# Patient Record
Sex: Female | Born: 1965 | Race: Black or African American | Hispanic: No | Smoking: Never smoker
Health system: Southern US, Community
[De-identification: ages and names within clinical notes are randomized; demographics above are authoritative.]

## PROBLEM LIST (undated history)

## (undated) DIAGNOSIS — D219 Benign neoplasm of connective and other soft tissue, unspecified: Secondary | ICD-10-CM

## (undated) HISTORY — PX: TUBAL LIGATION: SHX77

## (undated) HISTORY — PX: OTHER SURGICAL HISTORY: SHX169

---

## 2008-10-24 ENCOUNTER — Encounter: Admission: RE | Admit: 2008-10-24 | Discharge: 2008-10-24 | Payer: Self-pay | Admitting: Physician Assistant

## 2011-03-09 ENCOUNTER — Other Ambulatory Visit: Payer: Self-pay | Admitting: Obstetrics and Gynecology

## 2011-03-09 ENCOUNTER — Other Ambulatory Visit (HOSPITAL_COMMUNITY)
Admission: RE | Admit: 2011-03-09 | Discharge: 2011-03-09 | Disposition: A | Discharge status: Home / Self Care | Payer: Medicaid Other | Source: Ambulatory Visit | Attending: Obstetrics and Gynecology | Admitting: Obstetrics and Gynecology

## 2011-03-09 DIAGNOSIS — Z01419 Encounter for gynecological examination (general) (routine) without abnormal findings: Secondary | ICD-10-CM | POA: Insufficient documentation

## 2011-03-09 DIAGNOSIS — Z113 Encounter for screening for infections with a predominantly sexual mode of transmission: Secondary | ICD-10-CM | POA: Insufficient documentation

## 2011-03-10 ENCOUNTER — Other Ambulatory Visit: Payer: Self-pay | Admitting: Obstetrics and Gynecology

## 2013-07-20 ENCOUNTER — Emergency Department (HOSPITAL_COMMUNITY)
Admission: EM | Admit: 2013-07-20 | Discharge: 2013-07-20 | Disposition: A | Discharge status: Home / Self Care | Payer: Managed Care, Other (non HMO) | Attending: Emergency Medicine | Admitting: Emergency Medicine

## 2013-07-20 ENCOUNTER — Encounter (HOSPITAL_COMMUNITY): Payer: Self-pay | Admitting: Emergency Medicine

## 2013-07-20 ENCOUNTER — Emergency Department (HOSPITAL_COMMUNITY): Payer: Managed Care, Other (non HMO)

## 2013-07-20 DIAGNOSIS — R071 Chest pain on breathing: Secondary | ICD-10-CM | POA: Insufficient documentation

## 2013-07-20 DIAGNOSIS — Z79899 Other long term (current) drug therapy: Secondary | ICD-10-CM | POA: Insufficient documentation

## 2013-07-20 DIAGNOSIS — R0789 Other chest pain: Secondary | ICD-10-CM

## 2013-07-20 DIAGNOSIS — Z880 Allergy status to penicillin: Secondary | ICD-10-CM | POA: Insufficient documentation

## 2013-07-20 LAB — BASIC METABOLIC PANEL
CO2: 25 mEq/L (ref 19–32)
Calcium: 8.5 mg/dL (ref 8.4–10.5)
Chloride: 104 mEq/L (ref 96–112)
Glucose, Bld: 100 mg/dL — ABNORMAL HIGH (ref 70–99)
Sodium: 140 mEq/L (ref 135–145)

## 2013-07-20 LAB — POCT I-STAT TROPONIN I: Troponin i, poc: 0 ng/mL (ref 0.00–0.08)

## 2013-07-20 LAB — CBC
Hemoglobin: 10.1 g/dL — ABNORMAL LOW (ref 12.0–15.0)
MCH: 26 pg (ref 26.0–34.0)
MCV: 80.7 fL (ref 78.0–100.0)
RBC: 3.89 MIL/uL (ref 3.87–5.11)

## 2013-07-20 MED ORDER — IBUPROFEN 800 MG PO TABS: 800.0000 mg | ORAL_TABLET | Freq: Three times a day (TID) | ORAL | Status: DC | PRN

## 2013-07-20 MED ORDER — HYDROCODONE-ACETAMINOPHEN 5-325 MG PO TABS: 1.0000 | ORAL_TABLET | Freq: Four times a day (QID) | ORAL | Status: DC | PRN

## 2013-07-20 NOTE — ED Provider Notes (Signed)
CSN: [id number]     Arrival date & time 07/20/13  1508 History   First MD Initiated Contact with Patient 07/20/13 1526     Chief Complaint  Patient presents with  . Chest Pain   (Consider location/radiation/quality/duration/timing/severity/associated sxs/prior Treatment) HPI Patient is a 47 year old female with no PMH who presents to the Emergency Department complaining of 4-5 episodes of chest pain that started at 9 am today. Patient reports that she had 4-5 episodes of squeezing substernal chest pain today that lasted for 2-3 seconds with 1-2 hours between each episode. She reports that one of the episodes caused radiation of pain to her back. No radiation to her arms or jaw. She denies anything that triggered the pain, stating some of the episodes occurred while she was at rest and some occurred when she was walking. She denies SOB but reports someone told her she sounded SOB when she had the pain.  She reports that she felt nauseous this morning which resolved with eating and a non-productive cough that started this morning. She denies hemoptysis, fever, chills, diaphoresis, abdominal pain, constipation, diarrhea, urinary symptoms, lightheadedness, syncope, lower extremity swelling, and estrogen use. She took a 325 ASA prior to arrival. Patient denies tobacco use and reports occassional alcohol use. Patient currently is not having any chest pain.     History reviewed. No pertinent past medical history. No past surgical history on file. No family history on file. History  Substance Use Topics  . Smoking status: Not on file  . Smokeless tobacco: Not on file  . Alcohol Use: Not on file   OB History   Grav Para Term Preterm Abortions TAB SAB Ect Mult Living                 Review of Systems All other systems negative except as documented in the HPI. All pertinent positives and negatives as reviewed in the HPI.   Allergies  Penicillins  Home Medications   Current Outpatient Rx  Name   Route  Sig  Dispense  Refill  . Multiple Vitamins-Minerals (MULTIVITAMIN WITH MINERALS) tablet   Oral   Take 1 tablet by mouth daily.         . Nutritional Supp - Diet Aids (ULTRA SLIM QUICK PO)   Oral   Take 2 tablets by mouth daily.          BP 153/89  Pulse 104  Temp(Src) 98.1 F (36.7 C) (Oral)  Resp 20  Wt 175 lb (79.379 kg)  SpO2 100% Physical Exam  Nursing note and vitals reviewed. Constitutional: She is oriented to person, place, and time. She appears well-developed and well-nourished. No distress.  HENT:  Head: Normocephalic and atraumatic.  Eyes: Pupils are equal, round, and reactive to light.  Neck: Normal range of motion. Neck supple.  Cardiovascular: Normal rate, regular rhythm, normal heart sounds and intact distal pulses.  Exam reveals no gallop and no friction rub.   No murmur heard. Pulmonary/Chest: Effort normal and breath sounds normal. No respiratory distress. She has no wheezes. She has no rales.  Abdominal: Soft. Bowel sounds are normal. She exhibits no distension. There is no tenderness. There is no rebound and no guarding.  Musculoskeletal: Normal range of motion. She exhibits no edema and no tenderness.  Neurological: She is alert and oriented to person, place, and time.  Skin: Skin is warm and dry. She is not diaphoretic.  Psychiatric: She has a normal mood and affect. Her behavior is normal.  ED Course  Procedures (including critical care time) Labs Review Labs Reviewed  CBC - Abnormal; Notable for the following:    Hemoglobin 10.1 (*)    HCT 31.4 (*)    RDW 16.4 (*)    All other components within normal limits  BASIC METABOLIC PANEL  POCT I-STAT TROPONIN I    Patient has atypical symptoms for cardiac chest pain, as the pain, only last 2-3 seconds at a time.  Patient has been coughing.  Patient is PERC negative.  Patient is advised to return here as needed.  [facility]   Carlyle Dolly, PA-C 07/22/13 [phone]

## 2013-07-20 NOTE — ED Notes (Signed)
Pt c/o central cp that radiates to her back that started this morning. Pt denies any pain at this time. Pt described pain as squeezing.

## 2013-07-20 NOTE — ED Notes (Signed)
Discharge and follow up instructions reviewed. Pt verbalized understanding.  

## 2013-07-20 NOTE — ED Notes (Signed)
Pt reports she is having the squeezing feeling in her chest again. Pt states it lasted for about 2 seconds and then went away. Pt states it feels like a "spasm". Pain did not radiate. Denies any pain at this time.

## 2013-07-20 NOTE — ED Notes (Addendum)
Pt in c/o intermittent chest pain since this morning, states the pain comes on suddenly and lasts for a few seconds and is a squeezing pain, states a few times the pain has radiated into her back, denies pain at this time, states the episodes have occurred around 4-5 times today. C/o mild nausea at times, denies other symptoms. Pt took 325mg  ASA prior to arrival.

## 2013-07-22 NOTE — ED Provider Notes (Signed)
Medical screening examination/treatment/procedure(s) were performed by non-physician practitioner and as supervising physician I was immediately available for consultation/collaboration.  Flint Melter, MD 07/22/13 [phone]

## 2014-11-29 ENCOUNTER — Ambulatory Visit (INDEPENDENT_AMBULATORY_CARE_PROVIDER_SITE_OTHER): Payer: Self-pay | Admitting: Physician Assistant

## 2014-11-29 VITALS — BP 136/92 | HR 107 | Temp 97.9°F | Resp 18 | Ht 61.5 in | Wt 180.4 lb

## 2014-11-29 DIAGNOSIS — L299 Pruritus, unspecified: Secondary | ICD-10-CM

## 2014-11-29 DIAGNOSIS — L259 Unspecified contact dermatitis, unspecified cause: Secondary | ICD-10-CM

## 2014-11-29 MED ORDER — HYDROXYZINE HCL 25 MG PO TABS: 25.0000 mg | ORAL_TABLET | Freq: Three times a day (TID) | ORAL | Status: DC | PRN

## 2014-11-29 MED ORDER — METHYLPREDNISOLONE ACETATE 80 MG/ML IJ SUSP
80.0000 mg | Freq: Once | INTRAMUSCULAR | Status: AC
  Administered 2014-11-29: 80 mg via INTRAMUSCULAR

## 2014-11-29 MED ORDER — PREDNISONE 10 MG PO TABS: ORAL_TABLET | ORAL | Status: DC

## 2014-11-29 NOTE — Progress Notes (Signed)
   Subjective:    Patient ID: Samantha Fernandez, female    DOB: 23-Jan-1966, 49 y.o.   MRN: [id number]  HPI Pt presents to clinic with itching and burning and rash on her left arm that she has had about 9 days.  It started as a small red area on the flexor surface of her right elbow and then it got worse.  She was moving dusty boxes the day before she developed the rash.  She was working in the yard but she thinks that it was after the rash develop.  She does her ar with her arm on the open windowsill and thought maybe she for a sunburn.  The rash has continued to get worse and spread.  When she was moving and working in the yard she had on a tanktop and gloves.  Review of Systems  Constitutional: Negative for fever and chills.  Skin: Positive for rash.   She is otherwise healthy and only takes a MVI daily.     Objective:   Physical Exam  Constitutional: She is oriented to person, place, and time. She appears well-developed and well-nourished.  BP 136/92 mmHg  Pulse 107  Temp(Src) 97.9 F (36.6 C) (Oral)  Resp 18  Ht 5' 1.5" (1.562 m)  Wt 180 lb 6.4 oz (81.829 kg)  BMI 33.54 kg/m2  SpO2 98%   HENT:  Head: Normocephalic and atraumatic.  Right Ear: External ear normal.  Left Ear: External ear normal.  Cardiovascular:  No murmur heard. Pulmonary/Chest: Effort normal.  Neurological: She is alert and oriented to person, place, and time.  Skin: Skin is warm and dry.     Confluent erythematous papulovesicular rash mainly on her left arm, some of the area is oozing serous fluid - there are some scattered confluent areas on her neck, cleavage area and axillary edges  Psychiatric: She has a normal mood and affect. Her behavior is normal. Judgment and thought content normal.      Assessment & Plan:  Contact dermatitis - Plan: methylPREDNISolone acetate (DEPO-MEDROL) injection 80 mg, predniSONE (DELTASONE) 10 MG tablet  Itching - Plan: hydrOXYzine (ATARAX/VISTARIL) 25 MG tablet  This  is most consistent with poison ivy though patient is not aware of exposure.  It is definitely a contact dermatitis that is not currently infection.  We will treat with steroids.  A drsg was placed over the wound - nonstick and stockingnet as a sleeve. She will f/u if the rash worsens and definitley if there are signs on infection.  Windell Hummingbird PA-C  [facility] 11/29/2014 10:34 AM

## 2014-11-29 NOTE — Patient Instructions (Addendum)
You have a contact dermatitis but we are not sure what you are having a reaction to.  We are going to treat the inflammation with steroids.  Getting hot will make the itching worse.  Try to keep the area that is oozing covered to prevent infection.  If there are any signs of infection (fever, pus drainage or pain development) please return to clinic.

## 2017-01-27 ENCOUNTER — Ambulatory Visit: Payer: Self-pay | Admitting: Emergency Medicine

## 2017-01-28 ENCOUNTER — Ambulatory Visit (INDEPENDENT_AMBULATORY_CARE_PROVIDER_SITE_OTHER): Payer: Self-pay | Admitting: Emergency Medicine

## 2017-01-28 ENCOUNTER — Encounter: Payer: Self-pay | Admitting: Emergency Medicine

## 2017-01-28 VITALS — BP 150/88 | HR 92 | Temp 97.7°F | Resp 16 | Ht 62.0 in | Wt 182.8 lb

## 2017-01-28 DIAGNOSIS — R0789 Other chest pain: Secondary | ICD-10-CM

## 2017-01-28 DIAGNOSIS — T304 Corrosion of unspecified body region, unspecified degree: Secondary | ICD-10-CM

## 2017-01-28 DIAGNOSIS — T300 Burn of unspecified body region, unspecified degree: Secondary | ICD-10-CM

## 2017-01-28 MED ORDER — SILVER SULFADIAZINE 1 % EX CREA: 1.0000 "application " | TOPICAL_CREAM | Freq: Every day | CUTANEOUS | 0 refills | Status: DC

## 2017-01-28 MED ORDER — DICLOFENAC SODIUM 75 MG PO TBEC: 75.0000 mg | DELAYED_RELEASE_TABLET | Freq: Two times a day (BID) | ORAL | 1 refills | Status: AC

## 2017-01-28 NOTE — Patient Instructions (Addendum)
IF you received an x-ray today, you will receive an invoice from [facility]. Please contact [facility] at [phone] with questions or concerns regarding your invoice.   IF you received labwork today, you will receive an invoice from [facility]. Please contact [facility] at [phone] with questions or concerns regarding your invoice.   Our billing staff will not be able to assist you with questions regarding bills from these companies.  You will be contacted with the lab results as soon as they are available. The fastest way to get your results is to activate your My Chart account. Instructions are located on the last page of this paperwork. If you have not heard from [place] regarding the results in 2 weeks, please contact this office.    We recommend that you schedule a mammogram for breast cancer screening. Typically, you do not need a referral to do this. Please contact a local imaging center to schedule your mammogram.  [facility] - [phone]  *ask for the Radiology Department [facility] ([place]) - [phone] or [phone]  [facility] - [phone] [place] [phone] [facility] - [phone]  *ask for the [facility] - [phone]  *ask for the Radiology Department [facility] - [phone]  *ask for the [facility] - [phone] Burn Care, Adult A burn is an injury to the skin or the tissues under the skin. There are three types of burns:  First degree. These burns may cause the skin to be red and a bit swollen.  Second degree. These burns are very painful and cause the skin to be very red. The skin may also leak fluid, look shiny, and start to have blisters.  Third degree. These burns cause permanent damage. They turn the skin white or black and make it look charred, dry, and  leathery.  Taking care of your burn properly can help to prevent pain and infection. It can also help the burn to heal more quickly. How is this treated? Right after a burn:  Lightly cover the burn with gauze.  Get medical help right away. Burn care  Raise (elevate) the injured area above the level of your heart while sitting or lying down.  Drink enough fluid to keep your pee (urine) clear or pale yellow.  Rest as told by your doctor. Do not start playing sports or doing other physical activities until your doctor says it is okay.  Follow instructions from your doctor about: ? How to clean and take care of the burn. ? When to change and remove the bandage (dressing).  Check your burn every day for signs of infection. Check for: ? More redness, swelling, or pain. ? Warmth. ? Pus or a bad smell. Medicine  Take over-the-counter and prescription medicines only as told by your doctor.  If you were prescribed antibiotic medicine, take or apply it as told by your doctor. Do not stop using the antibiotic even if your condition improves. General instructions  To prevent infection: ? Do not put butter, oil, or other home treatments on the burn. ? Do not scratch or pick at the burn. ? Do not break any blisters. ? Do not peel skin.  Do not rub your burn, even when you are cleaning it.  Protect your burn from the sun.  Keep all follow-up visits as told by your doctor. This is important. Contact a  doctor if:  Your condition does not get better.  Your condition gets worse.  You have a fever.  Your burn looks different or starts to have black or red spots on it.  Your burn feels warm to the touch.  Your pain is not controlled with medicine. Get help right away if:  You have redness, swelling, or pain at the site of the burn.  You have fluid, blood, or pus coming from your burn.  You have red streaks near the burn.  You have very bad pain. How is this treated? Right  after a burn:  Rinse or soak the burn under cool water until the pain stops. Do not put ice on your burn. That can cause more damage.  Lightly cover the burn with a clean (sterile) cloth (dressing). Burn care  Follow instructions from your doctor about: ? How to clean and take care of the burn. ? When to change and remove the cloth.  Check your burn every day for signs of infection. Check for: ? More redness, swelling, or pain. ? Warmth. ? Pus or a bad smell. Medicine  Take over-the-counter and prescription medicines only as told by your doctor.  If you were prescribed antibiotic medicine, take or apply it as told by your doctor. Do not stop using the antibiotic even if your condition improves. General instructions  To prevent infection, do not put butter, oil, or other home treatments on your burn.  Do not rub your burn, even when you are cleaning it.  Protect your burn from the sun. How is this treated? Right after a burn:  Rinse or soak the burn under cool water. Do this for several minutes. Do not put ice on your burn. That can cause more damage.  Lightly cover the burn with a clean (sterile) cloth (dressing). Burn care  Raise (elevate) the injured area above the level of your heart while sitting or lying down.  Follow instructions from your doctor about: ? How to clean and take care of the burn. ? When to change and remove the cloth.  Check your burn every day for signs of infection. Check for: ? More redness, swelling, or pain. ? Warmth. ? Pus or a bad smell. Medicine   Take over-the-counter and prescription medicines only as told by your doctor.  If you were prescribed antibiotic medicine, take or apply it as told by your doctor. Do not stop using the antibiotic even if your condition improves. General instructions  To prevent infection: ? Do not put butter, oil, or other home treatments on the burn. ? Do not scratch or pick at the burn. ? Do not break  any blisters. ? Do not peel skin.  Do not rub your burn, even when you are cleaning it.  Protect your burn from the sun. This information is not intended to replace advice given to you by your health care provider. Make sure you discuss any questions you have with your health care provider. Document Released: 05/04/2008 Document Revised: 02/15/2016 Document Reviewed: 01/13/2016 Elsevier Interactive Patient Education  2017 [employer].
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
## 2017-02-10 ENCOUNTER — Encounter: Payer: Self-pay | Admitting: Emergency Medicine

## 2017-02-10 ENCOUNTER — Ambulatory Visit (INDEPENDENT_AMBULATORY_CARE_PROVIDER_SITE_OTHER): Payer: Self-pay | Admitting: Emergency Medicine

## 2017-02-10 VITALS — BP 154/98 | HR 98 | Temp 98.1°F | Resp 16 | Ht 62.0 in | Wt 183.0 lb

## 2017-02-10 DIAGNOSIS — L299 Pruritus, unspecified: Secondary | ICD-10-CM

## 2017-02-10 DIAGNOSIS — R21 Rash and other nonspecific skin eruption: Secondary | ICD-10-CM | POA: Insufficient documentation

## 2017-02-10 DIAGNOSIS — L309 Dermatitis, unspecified: Secondary | ICD-10-CM | POA: Insufficient documentation

## 2017-02-10 MED ORDER — METHYLPREDNISOLONE ACETATE 80 MG/ML IJ SUSP
80.0000 mg | Freq: Once | INTRAMUSCULAR | Status: AC
  Administered 2017-02-10: 80 mg via INTRAMUSCULAR

## 2017-02-10 MED ORDER — PREDNISONE 20 MG PO TABS: 40.0000 mg | ORAL_TABLET | Freq: Every day | ORAL | 0 refills | Status: AC

## 2017-02-10 NOTE — Patient Instructions (Signed)
Contact Dermatitis Dermatitis is redness, soreness, and swelling (inflammation) of the skin. Contact dermatitis is a reaction to certain substances that touch the skin. You either touched something that irritated your skin, or you have allergies to something you touched. Follow these instructions at home: Skin Care  Moisturize your skin as needed.  Apply cool compresses to the affected areas.  Try taking a bath with: ? Epsom salts. Follow the instructions on the package. You can get these at a pharmacy or grocery store. ? Baking soda. Pour a small amount into the bath as told by your doctor. ? Colloidal oatmeal. Follow the instructions on the package. You can get this at a pharmacy or grocery store.  Try applying baking soda paste to your skin. Stir water into baking soda until it looks like paste.  Do not scratch your skin.  Bathe less often.  Bathe in lukewarm water. Avoid using hot water. Medicines  Take or apply over-the-counter and prescription medicines only as told by your doctor.  If you were prescribed an antibiotic medicine, take or apply your antibiotic as told by your doctor. Do not stop taking the antibiotic even if your condition starts to get better. General instructions  Keep all follow-up visits as told by your doctor. This is important.  Avoid the substance that caused your reaction. If you do not know what caused it, keep a journal to try to track what caused it. Write down: ? What you eat. ? What cosmetic products you use. ? What you drink. ? What you wear in the affected area. This includes jewelry.  If you were given a bandage (dressing), take care of it as told by your doctor. This includes when to change and remove it. Contact a doctor if:  You do not get better with treatment.  Your condition gets worse.  You have signs of infection such as: ? Swelling. ? Tenderness. ? Redness. ? Soreness. ? Warmth.  You have a fever.  You have new  symptoms. Get help right away if:  You have a very bad headache.  You have neck pain.  Your neck is stiff.  You throw up (vomit).  You feel very sleepy.  You see red streaks coming from the affected area.  Your bone or joint underneath the affected area becomes painful after the skin has healed.  The affected area turns darker.  You have trouble breathing. This information is not intended to replace advice given to you by your health care provider. Make sure you discuss any questions you have with your health care provider. Document Released: 05/23/2009 Document Revised: 01/01/2016 Document Reviewed: 12/11/2014 Elsevier Interactive Patient Education  2018 Elsevier Inc.  
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
## 2018-01-04 ENCOUNTER — Other Ambulatory Visit: Payer: Self-pay | Admitting: Obstetrics and Gynecology

## 2018-01-04 DIAGNOSIS — D250 Submucous leiomyoma of uterus: Secondary | ICD-10-CM

## 2018-01-18 ENCOUNTER — Ambulatory Visit
Admission: RE | Admit: 2018-01-18 | Discharge: 2018-01-18 | Disposition: A | Discharge status: Home / Self Care | Payer: Managed Care, Other (non HMO) | Source: Ambulatory Visit | Attending: Obstetrics and Gynecology | Admitting: Obstetrics and Gynecology

## 2018-01-18 ENCOUNTER — Other Ambulatory Visit: Payer: Self-pay | Admitting: Obstetrics and Gynecology

## 2018-01-18 DIAGNOSIS — D250 Submucous leiomyoma of uterus: Secondary | ICD-10-CM

## 2018-01-18 HISTORY — DX: Benign neoplasm of connective and other soft tissue, unspecified: D21.9

## 2018-01-18 HISTORY — PX: IR RADIOLOGIST EVAL & MGMT: IMG5224

## 2018-01-18 NOTE — Consult Note (Signed)
Chief Complaint: Symptomatic uterine fibroids.  History of Present Illness: Samantha Fernandez is a 52 y.o. G43P1 African-American female with a long-standing history of uterine fibroids.  She has had a history of menorrhagia which recently has been worsening.  Menstrual cycles were quite regular until last October then became irregular.  Once they resumed this spring, she has had much heavier periods lasting approximately 7 days with 4 days of clots.  However, her last cycle in May lasted for 3 weeks.  She just began another cycle on 01/14/2018.  She was previously documented to be anemic in April with a hemoglobin of 10.8 and was started on iron replacement.  She has additional bulk symptoms of very frequent urination, nocturia and low back pain.  She has not had prior fibroid surgery.  Office pelvic ultrasound on 12/13/2017 demonstrated more than 6 fibroids with the largest 3 representing a posterior subserosal fibroid measuring 4.6 cm, a posterior pedunculated fibroid measuring 3.7 cm and a left-sided intramural fibroid measuring 3.3 cm.  Several other submucosal fibroids were visualized.  Pap smear on 12/13/2017 was negative.  A benign endocervical polyp was removed on 11/23/2017.  She denies any history of gynecologic infections.  She has 59 living 50 year old fraternal twins delivered by cesarean section.  She has no future pregnancy plans.  She is married and works full-time as a IT sales professional for [employer] and [employer]  Past Medical History:  Diagnosis Date  . Fibroids     No past surgical history on file.  Allergies: Penicillins  Medications: Prior to Admission medications   Medication Sig Start Date End Date Taking? Authorizing Provider  hydrOXYzine (ATARAX/VISTARIL) 25 MG tablet Take 1 tablet (25 mg total) by mouth 3 (three) times daily as needed for itching. 11/29/14  Yes Weber, Damaris Hippo, PA-C  Multiple Vitamins-Minerals (MULTIVITAMIN WITH MINERALS) tablet Take 1 tablet  by mouth daily.   Yes [provider]  silver sulfADIAZINE (SILVADENE) 1 % cream Apply 1 application topically daily. Patient not taking: Reported on 02/10/2017 01/28/17   Horald Pollen, MD     Family History  Problem Relation Age of Onset  . Diabetes Mother   . Diabetes Father   . Cancer Father   . Hypertension Father     Social History   Socioeconomic History  . Marital status: Widowed    Spouse name: Not on file  . Number of children: Not on file  . Years of education: Not on file  . Highest education level: Not on file  Occupational History  . Not on file  Social Needs  . Financial resource strain: Not on file  . Food insecurity:    Worry: Not on file    Inability: Not on file  . Transportation needs:    Medical: Not on file    Non-medical: Not on file  Tobacco Use  . Smoking status: Never Smoker  . Smokeless tobacco: Never Used  Substance and Sexual Activity  . Alcohol use: No    Alcohol/week: 0.0 oz  . Drug use: No  . Sexual activity: Yes  Lifestyle  . Physical activity:    Days per week: Not on file    Minutes per session: Not on file  . Stress: Not on file  Relationships  . Social connections:    Talks on phone: Not on file    Gets together: Not on file    Attends religious service: Not on file    Active member of club or  organization: Not on file    Attends meetings of clubs or organizations: Not on file    Relationship status: Not on file  Other Topics Concern  . Not on file  Social History Narrative  . Not on file    Review of Systems: A 12 point ROS discussed and pertinent positives are indicated in the HPI above.  All other systems are negative.  Review of Systems  Constitutional: Negative.   HENT: Negative.   Respiratory: Negative.   Cardiovascular: Negative.   Gastrointestinal: Negative.   Endocrine: Negative.   Genitourinary: Positive for frequency, menstrual problem, pelvic pain and urgency. Negative for dysuria, flank  pain, hematuria and vaginal discharge.  Musculoskeletal: Positive for back pain. Negative for arthralgias, gait problem and joint swelling.  Skin: Negative.   Neurological: Negative.   Hematological: Negative.     Vital Signs: BP 136/67   Pulse (!) 104   Temp 98 F (36.7 C) (Oral)   Resp 15   Ht _0  (1.575 m)   Wt 185 lb (83.9 kg)   LMP 01/14/2018   SpO2 98%   BMI 33.84 kg/m   Physical Exam  Constitutional: She is oriented to person, place, and time. She appears well-developed and well-nourished. No distress.  HENT:  Head: Normocephalic and atraumatic.  Neck: Neck supple. No JVD present. No tracheal deviation present. No thyromegaly present.  Cardiovascular: Normal rate, regular rhythm, normal heart sounds and intact distal pulses. Exam reveals no gallop and no friction rub.  No murmur heard. Normal bilateral femoral pulses.  Pulmonary/Chest: Effort normal and breath sounds normal. No stridor. No respiratory distress. She has no wheezes. She has no rales.  Abdominal: Soft. Bowel sounds are normal. She exhibits no distension. There is no tenderness. There is no rebound and no guarding.  Palpable anterior uterine fibroid in suprapubic region.  Musculoskeletal: She exhibits no edema.  Lymphadenopathy:    She has no cervical adenopathy.  Neurological: She is alert and oriented to person, place, and time.  Skin: Skin is warm and dry. She is not diaphoretic.  Vitals reviewed.   Imaging: No results found.  Labs:  CBC: WBC 8.4, hemoglobin 10.8, hematocrit 31.9 and platelet count 305 on 11/28/2017   Assessment and Plan:  I met with Mrs. Marijean Bravo.  We discussed treatment options for symptomatic uterine leiomyomata including hysterectomy and uterine fibroid embolization.  Her symptoms are increasing and she is interested in pursuing some type of treatment due to protracted menorrhagia.  She is particularly interested in fibroid embolization as a less invasive alternative with less  recovery time.  I discussed details of embolization with her including technical details, risks and expected outcomes.  I described the need for preprocedural MRI of the pelvis with and without gadolinium in order to fully define fibroid morphology and enhancement pattern in determining whether she is an appropriate candidate for embolization.  We will schedule an outpatient MRI of the pelvis.  After reviewing the MRI, I will contact Mrs. Ford and if she is a candidate and if she would like to proceed with embolization, we will begin the authorization and scheduling process.  The procedure would involve overnight observation at [facility] afterwards.  Thank you for this interesting consult.  I greatly enjoyed meeting IZZABELLA BESSE and look forward to participating in their care.  A copy of this report was sent to the requesting provider on this date.  Electronically SignedAletta Edouard T 01/18/2018, 10:05 AM     I spent a  total of 40 Minutes in face to face in clinical consultation, greater than 50% of which was counseling/coordinating care for symptomatic uterine fibroid disease.

## 2018-01-28 ENCOUNTER — Ambulatory Visit
Admission: RE | Admit: 2018-01-28 | Discharge: 2018-01-28 | Disposition: A | Discharge status: Home / Self Care | Payer: Managed Care, Other (non HMO) | Source: Ambulatory Visit | Attending: Obstetrics and Gynecology | Admitting: Obstetrics and Gynecology

## 2018-01-28 DIAGNOSIS — D250 Submucous leiomyoma of uterus: Secondary | ICD-10-CM

## 2018-01-28 MED ORDER — GADOBENATE DIMEGLUMINE 529 MG/ML IV SOLN
17.0000 mL | Freq: Once | INTRAVENOUS | Status: AC | PRN
  Administered 2018-01-28: 17 mL via INTRAVENOUS

## 2018-02-14 ENCOUNTER — Other Ambulatory Visit (HOSPITAL_COMMUNITY): Payer: Self-pay | Admitting: Interventional Radiology

## 2018-02-14 DIAGNOSIS — D259 Leiomyoma of uterus, unspecified: Secondary | ICD-10-CM

## 2018-05-04 ENCOUNTER — Other Ambulatory Visit: Payer: Self-pay | Admitting: Student

## 2018-05-05 ENCOUNTER — Other Ambulatory Visit: Payer: Self-pay

## 2018-05-05 ENCOUNTER — Observation Stay (HOSPITAL_COMMUNITY)
Admission: RE | Admit: 2018-05-05 | Discharge: 2018-05-06 | Disposition: A | Discharge status: Home / Self Care | Payer: 59 | Source: Ambulatory Visit | Attending: Interventional Radiology | Admitting: Interventional Radiology

## 2018-05-05 ENCOUNTER — Ambulatory Visit (HOSPITAL_COMMUNITY)
Admission: RE | Admit: 2018-05-05 | Discharge: 2018-05-05 | Disposition: A | Discharge status: Home / Self Care | Payer: 59 | Source: Ambulatory Visit | Attending: Interventional Radiology | Admitting: Interventional Radiology

## 2018-05-05 ENCOUNTER — Encounter (HOSPITAL_COMMUNITY): Payer: Self-pay

## 2018-05-05 DIAGNOSIS — N920 Excessive and frequent menstruation with regular cycle: Secondary | ICD-10-CM | POA: Diagnosis not present

## 2018-05-05 DIAGNOSIS — D259 Leiomyoma of uterus, unspecified: Principal | ICD-10-CM | POA: Insufficient documentation

## 2018-05-05 DIAGNOSIS — Z880 Allergy status to penicillin: Secondary | ICD-10-CM | POA: Insufficient documentation

## 2018-05-05 DIAGNOSIS — Z9889 Other specified postprocedural states: Secondary | ICD-10-CM | POA: Insufficient documentation

## 2018-05-05 DIAGNOSIS — Z9851 Tubal ligation status: Secondary | ICD-10-CM | POA: Insufficient documentation

## 2018-05-05 HISTORY — PX: IR EMBO TUMOR ORGAN ISCHEMIA INFARCT INC GUIDE ROADMAPPING: IMG5449

## 2018-05-05 HISTORY — PX: IR ANGIOGRAM PELVIS SELECTIVE OR SUPRASELECTIVE: IMG661

## 2018-05-05 HISTORY — PX: IR US GUIDE VASC ACCESS RIGHT: IMG2390

## 2018-05-05 HISTORY — PX: IR ANGIOGRAM SELECTIVE EACH ADDITIONAL VESSEL: IMG667

## 2018-05-05 LAB — CBC WITH DIFFERENTIAL/PLATELET
BASOS ABS: 0 10*3/uL (ref 0.0–0.1)
BASOS PCT: 1 %
EOS PCT: 2 %
Eosinophils Absolute: 0.1 10*3/uL (ref 0.0–0.7)
HEMATOCRIT: 38.1 % (ref 36.0–46.0)
Hemoglobin: 12.5 g/dL (ref 12.0–15.0)
Lymphocytes Relative: 40 %
Lymphs Abs: 2.2 10*3/uL (ref 0.7–4.0)
MCH: 27.6 pg (ref 26.0–34.0)
MCHC: 32.8 g/dL (ref 30.0–36.0)
MCV: 84.1 fL (ref 78.0–100.0)
MONO ABS: 0.4 10*3/uL (ref 0.1–1.0)
Monocytes Relative: 7 %
NEUTROS ABS: 2.8 10*3/uL (ref 1.7–7.7)
Neutrophils Relative %: 50 %
PLATELETS: 255 10*3/uL (ref 150–400)
RBC: 4.53 MIL/uL (ref 3.87–5.11)
RDW: 19.3 % — AB (ref 11.5–15.5)
WBC: 5.6 10*3/uL (ref 4.0–10.5)

## 2018-05-05 LAB — BASIC METABOLIC PANEL
ANION GAP: 11 (ref 5–15)
BUN: 10 mg/dL (ref 6–20)
CALCIUM: 9 mg/dL (ref 8.9–10.3)
CO2: 23 mmol/L (ref 22–32)
Chloride: 107 mmol/L (ref 98–111)
Creatinine, Ser: 0.88 mg/dL (ref 0.44–1.00)
GFR calc Af Amer: >60 mL/min (ref >60)
GLUCOSE: 118 mg/dL — AB (ref 70–99)
Potassium: 3.8 mmol/L (ref 3.5–5.1)
SODIUM: 141 mmol/L (ref 135–145)

## 2018-05-05 LAB — PROTIME-INR
INR: 0.92
PROTHROMBIN TIME: 12.3 s (ref 11.4–15.2)

## 2018-05-05 LAB — HCG, SERUM, QUALITATIVE: Preg, Serum: NEGATIVE

## 2018-05-05 MED ORDER — MIDAZOLAM HCL 2 MG/2ML IJ SOLN
INTRAMUSCULAR | Status: AC
  Filled 2018-05-05: qty 6

## 2018-05-05 MED ORDER — VANCOMYCIN HCL IN DEXTROSE 1-5 GM/200ML-% IV SOLN
INTRAVENOUS | Status: AC
  Administered 2018-05-05: 1000 mg via INTRAVENOUS
  Filled 2018-05-05: qty 200

## 2018-05-05 MED ORDER — SODIUM CHLORIDE 0.9% FLUSH
3.0000 mL | Freq: Two times a day (BID) | INTRAVENOUS | Status: DC
  Administered 2018-05-06: 3 mL via INTRAVENOUS

## 2018-05-05 MED ORDER — DEXAMETHASONE SODIUM PHOSPHATE 10 MG/ML IJ SOLN
INTRAMUSCULAR | Status: AC
  Administered 2018-05-05: 8 mg via INTRAVENOUS
  Filled 2018-05-05: qty 1

## 2018-05-05 MED ORDER — NALOXONE HCL 0.4 MG/ML IJ SOLN: 0.4000 mg | INTRAMUSCULAR | Status: DC | PRN

## 2018-05-05 MED ORDER — ONDANSETRON HCL 4 MG/2ML IJ SOLN
4.0000 mg | Freq: Four times a day (QID) | INTRAMUSCULAR | Status: DC | PRN
  Administered 2018-05-06 (×2): 4 mg via INTRAVENOUS
  Filled 2018-05-05 (×2): qty 2

## 2018-05-05 MED ORDER — VANCOMYCIN HCL IN DEXTROSE 1-5 GM/200ML-% IV SOLN
1000.0000 mg | Freq: Once | INTRAVENOUS | Status: AC
  Administered 2018-05-05: 1000 mg via INTRAVENOUS

## 2018-05-05 MED ORDER — PROMETHAZINE HCL 25 MG RE SUPP: 25.0000 mg | Freq: Three times a day (TID) | RECTAL | Status: DC | PRN

## 2018-05-05 MED ORDER — DOCUSATE SODIUM 100 MG PO CAPS
100.0000 mg | ORAL_CAPSULE | Freq: Two times a day (BID) | ORAL | Status: DC
  Administered 2018-05-05 – 2018-05-06 (×2): 100 mg via ORAL
  Filled 2018-05-05 (×2): qty 1

## 2018-05-05 MED ORDER — DEXAMETHASONE 4 MG PO TABS
8.0000 mg | ORAL_TABLET | Freq: Once | ORAL | Status: AC
  Administered 2018-05-06: 8 mg via ORAL
  Filled 2018-05-05: qty 2

## 2018-05-05 MED ORDER — NALOXONE HCL 0.4 MG/ML IJ SOLN
INTRAMUSCULAR | Status: AC
  Filled 2018-05-05: qty 1

## 2018-05-05 MED ORDER — SODIUM CHLORIDE 0.9% FLUSH: 3.0000 mL | INTRAVENOUS | Status: DC | PRN

## 2018-05-05 MED ORDER — FENTANYL CITRATE (PF) 100 MCG/2ML IJ SOLN
INTRAMUSCULAR | Status: AC | PRN
  Administered 2018-05-05 (×4): 25 ug via INTRAVENOUS
  Administered 2018-05-05: 50 ug via INTRAVENOUS
  Administered 2018-05-05 (×2): 25 ug via INTRAVENOUS
  Administered 2018-05-05: 50 ug via INTRAVENOUS

## 2018-05-05 MED ORDER — ONDANSETRON HCL 4 MG/2ML IJ SOLN
4.0000 mg | Freq: Once | INTRAMUSCULAR | Status: AC
  Administered 2018-05-05: 4 mg via INTRAVENOUS

## 2018-05-05 MED ORDER — MIDAZOLAM HCL 2 MG/2ML IJ SOLN
INTRAMUSCULAR | Status: AC | PRN
  Administered 2018-05-05 (×5): 0.5 mg via INTRAVENOUS
  Administered 2018-05-05: 1 mg via INTRAVENOUS
  Administered 2018-05-05: 0.5 mg via INTRAVENOUS

## 2018-05-05 MED ORDER — FLUMAZENIL 0.5 MG/5ML IV SOLN
INTRAVENOUS | Status: AC
  Filled 2018-05-05: qty 5

## 2018-05-05 MED ORDER — DIPHENHYDRAMINE HCL 50 MG/ML IJ SOLN: 12.5000 mg | Freq: Four times a day (QID) | INTRAMUSCULAR | Status: DC | PRN

## 2018-05-05 MED ORDER — PROMETHAZINE HCL 25 MG PO TABS
25.0000 mg | ORAL_TABLET | Freq: Three times a day (TID) | ORAL | Status: DC | PRN
  Administered 2018-05-06: 25 mg via ORAL
  Filled 2018-05-05: qty 1

## 2018-05-05 MED ORDER — FENTANYL CITRATE (PF) 100 MCG/2ML IJ SOLN
INTRAMUSCULAR | Status: AC
  Filled 2018-05-05: qty 6

## 2018-05-05 MED ORDER — KETOROLAC TROMETHAMINE 30 MG/ML IJ SOLN
30.0000 mg | Freq: Once | INTRAMUSCULAR | Status: AC
  Administered 2018-05-05: 30 mg via INTRAVENOUS

## 2018-05-05 MED ORDER — HYDROMORPHONE 1 MG/ML IV SOLN
INTRAVENOUS | Status: DC
  Administered 2018-05-05: 0.1 mg via INTRAVENOUS
  Administered 2018-05-05: 0.6 mg via INTRAVENOUS
  Administered 2018-05-05: 10:00:00 via INTRAVENOUS
  Administered 2018-05-05: 5.7 mg via INTRAVENOUS
  Administered 2018-05-06: 1.8 mg via INTRAVENOUS
  Administered 2018-05-06 (×2): 2.4 mg via INTRAVENOUS
  Filled 2018-05-05: qty 25

## 2018-05-05 MED ORDER — IOHEXOL 300 MG/ML  SOLN
100.0000 mL | Freq: Once | INTRAMUSCULAR | Status: AC | PRN
  Administered 2018-05-05: 8 mL via INTRA_ARTERIAL

## 2018-05-05 MED ORDER — KETOROLAC TROMETHAMINE 30 MG/ML IJ SOLN
30.0000 mg | Freq: Four times a day (QID) | INTRAMUSCULAR | Status: DC
  Administered 2018-05-05 – 2018-05-06 (×3): 30 mg via INTRAVENOUS
  Filled 2018-05-05 (×3): qty 1

## 2018-05-05 MED ORDER — IOHEXOL 300 MG/ML  SOLN
100.0000 mL | Freq: Once | INTRAMUSCULAR | Status: AC | PRN
  Administered 2018-05-05: 50 mL via INTRA_ARTERIAL

## 2018-05-05 MED ORDER — IOHEXOL 300 MG/ML  SOLN
100.0000 mL | Freq: Once | INTRAMUSCULAR | Status: AC | PRN
  Administered 2018-05-05: 28 mL via INTRA_ARTERIAL

## 2018-05-05 MED ORDER — DIPHENHYDRAMINE HCL 12.5 MG/5ML PO ELIX
12.5000 mg | ORAL_SOLUTION | Freq: Four times a day (QID) | ORAL | Status: DC | PRN
  Filled 2018-05-05: qty 5

## 2018-05-05 MED ORDER — ONDANSETRON HCL 4 MG/2ML IJ SOLN
INTRAMUSCULAR | Status: AC
  Administered 2018-05-05: 4 mg via INTRAVENOUS
  Filled 2018-05-05: qty 2

## 2018-05-05 MED ORDER — LIDOCAINE HCL 1 % IJ SOLN
INTRAMUSCULAR | Status: AC
  Filled 2018-05-05: qty 20

## 2018-05-05 MED ORDER — ONDANSETRON HCL 4 MG/2ML IJ SOLN
4.0000 mg | Freq: Four times a day (QID) | INTRAMUSCULAR | Status: DC | PRN
  Administered 2018-05-06: 4 mg via INTRAVENOUS
  Filled 2018-05-05: qty 2

## 2018-05-05 MED ORDER — SODIUM CHLORIDE 0.9% FLUSH: 9.0000 mL | INTRAVENOUS | Status: DC | PRN

## 2018-05-05 MED ORDER — SODIUM CHLORIDE 0.9 % IV SOLN: 250.0000 mL | INTRAVENOUS | Status: DC | PRN

## 2018-05-05 MED ORDER — DEXAMETHASONE SODIUM PHOSPHATE 10 MG/ML IJ SOLN
8.0000 mg | Freq: Once | INTRAMUSCULAR | Status: AC
  Administered 2018-05-05: 8 mg via INTRAVENOUS

## 2018-05-05 MED ORDER — SODIUM CHLORIDE 0.9 % IV SOLN
INTRAVENOUS | Status: DC
  Administered 2018-05-05: 09:00:00 via INTRAVENOUS

## 2018-05-05 NOTE — H&P (Signed)
Referring Physician(s): Cousins,S  Supervising Physician: Aletta Edouard  Patient Status:  [facility] OP TBA  Chief Complaint:  Symptomatic uterine fibroids  Subjective: Patient familiar to IR service from consultation with Dr. Kathlene Cote on 01/18/2018 to discuss treatment options for symptomatic uterine fibroids.  She was deemed an appropriate candidate for bilateral uterine artery embolization and presents today for the procedure.  She currently denies fever, headache, chest pain, dyspnea, cough, worsening abdominal/back pain, nausea, vomiting, dysuria, hematuria or blood in stool.  She has some occasional vaginal spotting and pelvic pressure.  Past Medical History:  Diagnosis Date  . Fibroids    Past Surgical History:  Procedure Laterality Date  . abdominal plasty    . CESAREAN SECTION    . IR RADIOLOGIST EVAL & MGMT  01/18/2018  . TUBAL LIGATION        Allergies: Penicillins  Medications: Prior to Admission medications   Medication Sig Start Date End Date Taking? Authorizing Provider  hydrOXYzine (ATARAX/VISTARIL) 25 MG tablet Take 1 tablet (25 mg total) by mouth 3 (three) times daily as needed for itching. 11/29/14   Gale Journey, Damaris Hippo, PA-C  Multiple Vitamins-Minerals (MULTIVITAMIN WITH MINERALS) tablet Take 1 tablet by mouth daily.    [provider]  silver sulfADIAZINE (SILVADENE) 1 % cream Apply 1 application topically daily. Patient not taking: Reported on 02/10/2017 01/28/17   Horald Pollen, MD     Vital Signs: BP (!) 148/94   Pulse 86   Temp 98 F (36.7 C) (Oral)   Resp 16   SpO2 99%   Physical Exam awake, alert.  Chest clear to auscultation bilaterally.  Heart with regular rate and rhythm.  Abdomen soft, positive bowel sounds, fibroid uterus, mild tenderness to palpation.  Lower extremity edema, intact distal pulses.  Imaging: No results found.  Labs:  CBC: Recent Labs    05/05/18 0816  WBC 5.6  HGB 12.5  HCT 38.1  PLT 255     COAGS: Recent Labs    05/05/18 0816  INR 0.92    BMP: No results for input(s): NA, K, CL, CO2, GLUCOSE, BUN, CALCIUM, CREATININE, GFRNONAA, GFRAA in the last 8760 hours.  Invalid input(s): CMP  LIVER FUNCTION TESTS: No results for input(s): BILITOT, AST, ALT, ALKPHOS, PROT, ALBUMIN in the last 8760 hours.  Assessment and Plan: Patient with history of symptomatic uterine fibroids; in consultation by Dr. Kathlene Cote on 01/18/2018 and deemed an appropriate candidate for bilateral uterine artery embolization.  She presents today for the procedure.Risks and benefits of procedure were discussed with the patient/spouse including, but not limited to bleeding, infection, vascular injury or contrast induced renal failure.  This interventional procedure involves the use of X-rays and because of the nature of the planned procedure, it is possible that we will have prolonged use of X-ray fluoroscopy.  Potential radiation risks to you include (but are not limited to) the following: - A slightly elevated risk for cancer  several years later in life. This risk is typically less than 0.5% percent. This risk is low in comparison to the normal incidence of human cancer, which is 33% for women and 50% for men according to the [employer]. - Radiation induced injury can include skin redness, resembling a rash, tissue breakdown / ulcers and hair loss (which can be temporary or permanent).   The likelihood of either of these occurring depends on the difficulty of the procedure and whether you are sensitive to radiation due to previous procedures, disease, or genetic  conditions.   IF your procedure requires a prolonged use of radiation, you will be notified and given written instructions for further action.  It is your responsibility to monitor the irradiated area for the 2 weeks following the procedure and to notify your physician if you are concerned that you have suffered a radiation induced  injury.    All of the patient's questions were answered, patient is agreeable to proceed.  Consent signed and in chart.  Post procedure she will be admitted to the hospital for overnight observation for pain control    Electronically Signed: D. Rowe Robert, PA-C 05/05/2018, 8:48 AM   I spent a total of 30 minutes at the the patient's bedside AND on the patient's hospital floor or unit, greater than 50% of which was counseling/coordinating care for bilateral uterine artery embolization

## 2018-05-05 NOTE — Procedures (Signed)
Interventional Radiology Procedure Note  Procedure: Uterine fibroid embolization  Complications: None  Estimated Blood Loss: < 10 mL  Findings: Bilateral uterine arteries catheterized and embolized with 2 vials of 500-700 micron sized Embospheres particles each.  Good result.  ExoSeal did not result in good hemostasis after deployment.  Manual pressure held over right femoral arteriotomy after sheath removal  Plan: Overnight observation Dilaudid PCA  Johnni Wunschel T. Kathlene Cote, M.D Pager:  [phone]

## 2018-05-05 NOTE — Progress Notes (Signed)
Patient ID: Samantha Fernandez, female   DOB: September 27, 1965, 52 y.o.   MRN: [id number] Patient doing okay; has some pelvic cramping as expected Vital signs stable, afebrile Abdomen soft, positive bowel sounds, mild tenderness to palpation lower pelvic region Right common femoral artery puncture site soft, clean, dry, no hematoma, NT  A/P: Symptomatic uterine fibroids, status post bilateral uterine artery embolization earlier today; for overnight observation, Dilaudid PCA for pain, d/c Foley later this evening; follow-up with Dr. Kathlene Cote in [place] in 3 to 4 weeks.  Rowe Robert, [facility]

## 2018-05-06 DIAGNOSIS — D259 Leiomyoma of uterus, unspecified: Secondary | ICD-10-CM | POA: Diagnosis not present

## 2018-05-06 LAB — CBC
HCT: 39.6 % (ref 36.0–46.0)
HEMOGLOBIN: 12.8 g/dL (ref 12.0–15.0)
MCH: 27.7 pg (ref 26.0–34.0)
MCHC: 32.3 g/dL (ref 30.0–36.0)
MCV: 85.7 fL (ref 78.0–100.0)
Platelets: 292 10*3/uL (ref 150–400)
RBC: 4.62 MIL/uL (ref 3.87–5.11)
RDW: 19.6 % — ABNORMAL HIGH (ref 11.5–15.5)
WBC: 10.4 10*3/uL (ref 4.0–10.5)

## 2018-05-06 MED ORDER — IBUPROFEN 600 MG PO TABS: 600.0000 mg | ORAL_TABLET | Freq: Four times a day (QID) | ORAL | 0 refills | Status: AC | PRN

## 2018-05-06 MED ORDER — IBUPROFEN 200 MG PO TABS
600.0000 mg | ORAL_TABLET | Freq: Four times a day (QID) | ORAL | Status: DC | PRN
  Administered 2018-05-06: 600 mg via ORAL
  Filled 2018-05-06: qty 3

## 2018-05-06 MED ORDER — SODIUM CHLORIDE 0.9 % IV SOLN: 250.0000 mL | INTRAVENOUS | Status: DC | PRN

## 2018-05-06 MED ORDER — OXYCODONE-ACETAMINOPHEN 5-325 MG PO TABS
1.0000 | ORAL_TABLET | ORAL | Status: DC | PRN
  Administered 2018-05-06 (×2): 2 via ORAL
  Filled 2018-05-06 (×2): qty 2

## 2018-05-06 NOTE — Discharge Summary (Signed)
Patient ID: Samantha Fernandez MRN: [id number] DOB/AGE: 50-Dec-1967 50 y.o.  Admit date: 05/05/2018 Discharge date: 05/06/2018  Supervising Physician: Samantha Fernandez  Patient Status: [facility] - In-pt  Admission Diagnoses: Symptomatic uterine fibroids  Discharge Diagnoses:  Active Problems:   Uterine fibroid   Discharged Condition: good  Hospital Course:  Samantha Fernandez is a 52 year old female who presented to radiology department yesterday for treatment of her symptomatic uterine fibroids.  She underwent successful uterine artery embolization procedure with Dr. Kathlene Fernandez.  She was admitted overnight for observation and pain control.  Patient with expected post-procedure pain and cramping.  She had difficulty with urination and hydration overnight due to nausea, vomiting.  This has improved with IV fluids, nausea medication, and adequate pain control.  She has been able to advance her diet with tolerance.  She has successfully voided with clear, yellow urine. She does have some spotting which is expected.  She has been able to ambulate without difficulty.  She is stable for discharge home today.  Discharge instructions provided including pain management and site care.  She understands she should take Ibuprofen 600 mg q 6 hrs at home for the next several days for pain and inflammation.  She is also given a prescription for Percocet 5/325 mg for break-through pain at home.  She understands she will hear from schedulers with date and time of follow-up appointment with Dr. Kathlene Fernandez.   Discharge Exam: Blood pressure (!) 160/93, pulse 98, temperature 99.1 F (37.3 C), temperature source Oral, resp. rate 16, SpO2 96 %. General appearance: alert, cooperative and no distress Resp: clear to auscultation bilaterally Cardio: regular rate and rhythm, S1, S2 normal, no murmur, click, rub or gallop GI: soft, non-tender; bowel sounds normal; no masses,  no organomegaly Skin: Skin color, texture,  turgor normal. No rashes or lesions or erythema.  Groin site intact, no evidence of hematoma or pseudoaneurysm  Disposition: Discharge disposition: 01-Home or Self Care       Discharge Instructions    Call MD for:  persistant nausea and vomiting   Complete by:  As directed    Call MD for:  redness, tenderness, or signs of infection (pain, swelling, redness, odor or green/yellow discharge around incision site)   Complete by:  As directed    Call MD for:  severe uncontrolled pain   Complete by:  As directed    Call MD for:  temperature >100.4   Complete by:  As directed    Diet - low sodium heart healthy   Complete by:  As directed    Discharge instructions   Complete by:  As directed    May place band-aid to groin tomorrow.   Keep clean and dry until well-scabbed and starting to heal.  Take 600 mg ibuprofen q 4-6 hrs for pain and inflammation.   Use narcotic pain medication only as needed to control breakthrough pain.  Do not drive if taking narcotics.  May take over-the-counter stool softeners as needed for constipation.  You will hear from schedulers with date and time of follow-up appointment.   Increase activity slowly   Complete by:  As directed      Allergies as of 05/06/2018      Reactions   Penicillins Rash   Has patient had a PCN reaction causing immediate rash, facial/tongue/throat swelling, SOB or lightheadedness with hypotension: Yes Has patient had a PCN reaction causing severe rash involving mucus membranes or skin necrosis:No Has patient had a PCN reaction  that required hospitalization: No Has patient had a PCN reaction occurring within the last 10 years: No If all of the above answers are "NO", then may proceed with Cephalosporin use.      Medication List    TAKE these medications   diphenhydrAMINE 25 MG tablet Commonly known as:  BENADRYL Take 25 mg by mouth every 6 (six) hours as needed for allergies.   hydrOXYzine 25 MG tablet Commonly known as:   ATARAX/VISTARIL Take 1 tablet (25 mg total) by mouth 3 (three) times daily as needed for itching.   ibuprofen 600 MG tablet Commonly known as:  ADVIL,MOTRIN Take 1 tablet (600 mg total) by mouth every 6 (six) hours as needed for cramping.   multivitamin with minerals tablet Take 1 tablet by mouth daily.   silver sulfADIAZINE 1 % cream Commonly known as:  SILVADENE Apply 1 application topically daily.      Follow-up Information    Samantha Edouard, MD Follow up.   Specialties:  Interventional Radiology, Radiology Why:  You will hear from schedulers with date and time of appointment.  Contact information: [address] [place] [address] [phone]            Electronically Signed: Docia Barrier, PA 05/06/2018, 3:03 PM   I have spent Greater Than 30 Minutes discharging [place].

## 2018-05-06 NOTE — Progress Notes (Signed)
Spotting noted when pt got up to bedside commode. Unable to void at this time. PA on floor, made aware.

## 2018-06-07 ENCOUNTER — Encounter: Payer: Self-pay | Admitting: Radiology

## 2018-06-07 ENCOUNTER — Ambulatory Visit
Admission: RE | Admit: 2018-06-07 | Discharge: 2018-06-07 | Disposition: A | Discharge status: Home / Self Care | Payer: 59 | Source: Ambulatory Visit | Attending: Student | Admitting: Student

## 2018-06-07 DIAGNOSIS — D259 Leiomyoma of uterus, unspecified: Secondary | ICD-10-CM

## 2018-06-07 HISTORY — PX: IR RADIOLOGIST EVAL & MGMT: IMG5224

## 2018-06-07 NOTE — Progress Notes (Signed)
Chief Complaint: Follow-up status post uterine fibroid embolization.  History of Present Illness: Samantha Fernandez is a 52 y.o. female status post uterine fibroid embolization on 05/05/2018.  After some expected post embolization pain, Ms. Marijean Bravo was discharged the next morning.  She had a small amount of spotting overnight immediately after the procedure.  She has done well after the procedure with a few days of cramping after discharge.  She is now back to full activity and full-time work.  She clearly notices decrease in pelvic pressure and low back pain after the procedure.  She has not had a menstrual cycle.  She has not had any spotting, abnormal vaginal discharge, significant pelvic pain or fever at home.  Past Medical History:  Diagnosis Date  . Fibroids     Past Surgical History:  Procedure Laterality Date  . abdominal plasty    . CESAREAN SECTION    . IR ANGIOGRAM PELVIS SELECTIVE OR SUPRASELECTIVE  05/05/2018  . IR ANGIOGRAM PELVIS SELECTIVE OR SUPRASELECTIVE  05/05/2018  . IR ANGIOGRAM SELECTIVE EACH ADDITIONAL VESSEL  05/05/2018  . IR ANGIOGRAM SELECTIVE EACH ADDITIONAL VESSEL  05/05/2018  . IR EMBO TUMOR ORGAN ISCHEMIA INFARCT INC GUIDE ROADMAPPING  05/05/2018  . IR RADIOLOGIST EVAL & MGMT  01/18/2018  . IR US GUIDE VASC ACCESS RIGHT  05/05/2018  . TUBAL LIGATION      Allergies: Penicillins  Medications: Prior to Admission medications   Medication Sig Start Date End Date Taking? Authorizing Provider  Multiple Vitamins-Minerals (MULTIVITAMIN WITH MINERALS) tablet Take 1 tablet by mouth daily.   Yes [provider]  ibuprofen (ADVIL,MOTRIN) 600 MG tablet Take 1 tablet (600 mg total) by mouth every 6 (six) hours as needed for cramping. Patient not taking: Reported on 06/07/2018 05/06/18   Docia Barrier, PA     Family History  Problem Relation Age of Onset  . Diabetes Mother   . Diabetes Father   . Cancer Father   . Hypertension Father      Social History   Socioeconomic History  . Marital status: Widowed    Spouse name: Not on file  . Number of children: Not on file  . Years of education: Not on file  . Highest education level: Not on file  Occupational History  . Not on file  Social Needs  . Financial resource strain: Not on file  . Food insecurity:    Worry: Not on file    Inability: Not on file  . Transportation needs:    Medical: Not on file    Non-medical: Not on file  Tobacco Use  . Smoking status: Never Smoker  . Smokeless tobacco: Never Used  Substance and Sexual Activity  . Alcohol use: No    Alcohol/week: 0.0 standard drinks  . Drug use: No  . Sexual activity: Yes  Lifestyle  . Physical activity:    Days per week: Not on file    Minutes per session: Not on file  . Stress: Not on file  Relationships  . Social connections:    Talks on phone: Not on file    Gets together: Not on file    Attends religious service: Not on file    Active member of club or organization: Not on file    Attends meetings of clubs or organizations: Not on file    Relationship status: Not on file  Other Topics Concern  . Not on file  Social History Narrative  . Not on file  Review of Systems: A 12 point ROS discussed and pertinent positives are indicated in the HPI above.  All other systems are negative.  Review of Systems  Constitutional: Negative.   Respiratory: Negative.   Cardiovascular: Negative.   Gastrointestinal: Negative.   Genitourinary: Negative.   Musculoskeletal: Negative.   Neurological: Negative.     Vital Signs: BP 127/75   Pulse (!) 103   Temp 97.8 F (36.6 C) (Oral)   Resp 15   Ht 5\' 2"  (1.575 m)   Wt 81.2 kg   SpO2 98%   BMI 32.74 kg/m   Physical Exam  Constitutional: She is oriented to person, place, and time. She appears well-developed and well-nourished. No distress.  Neurological: She is alert and oriented to person, place, and time.  Skin: She is not diaphoretic.   Vitals reviewed.    Imaging: No results found.  Labs:  CBC: Recent Labs    05/05/18 0816 05/06/18 0429  WBC 5.6 10.4  HGB 12.5 12.8  HCT 38.1 39.6  PLT 255 292    COAGS: Recent Labs    05/05/18 0816  INR 0.92    BMP: Recent Labs    05/05/18 0816  NA 141  K 3.8  CL 107  CO2 23  GLUCOSE 118*  BUN 10  CALCIUM 9.0  CREATININE 0.88  GFRNONAA >60  GFRAA >60    Assessment and Plan:  [name] is doing very well after fibroid embolization and already notices decrease in pelvic pressure and low back pain 1 month after the procedure.  She has not yet had a menstrual cycle.  There are no signs of complication following fibroid embolization.  I have recommended a follow-up MRI in 8 to 10 months and will meet with her following the MRI to review findings and compare results with the preprocedural MRI.  Electronically SignedAletta Edouard T 06/07/2018, 8:38 AM     I spent a total of 15 Minutes in face to face in clinical consultation, greater than 50% of which was counseling/coordinating care post uterine fibroid embolization.

## 2019-03-06 ENCOUNTER — Other Ambulatory Visit: Payer: Self-pay | Admitting: Obstetrics and Gynecology

## 2019-03-06 DIAGNOSIS — D250 Submucous leiomyoma of uterus: Secondary | ICD-10-CM

## 2019-04-25 ENCOUNTER — Other Ambulatory Visit: Payer: Self-pay | Admitting: Obstetrics and Gynecology

## 2019-04-25 DIAGNOSIS — D250 Submucous leiomyoma of uterus: Secondary | ICD-10-CM

## 2019-05-04 ENCOUNTER — Ambulatory Visit (HOSPITAL_COMMUNITY): Payer: 59

## 2019-05-10 ENCOUNTER — Other Ambulatory Visit: Payer: Self-pay

## 2019-05-10 ENCOUNTER — Ambulatory Visit
Admission: RE | Admit: 2019-05-10 | Discharge: 2019-05-10 | Disposition: A | Discharge status: Home / Self Care | Payer: 59 | Source: Ambulatory Visit | Attending: Obstetrics and Gynecology | Admitting: Obstetrics and Gynecology

## 2019-05-10 DIAGNOSIS — D250 Submucous leiomyoma of uterus: Secondary | ICD-10-CM

## 2020-04-06 ENCOUNTER — Emergency Department (HOSPITAL_COMMUNITY)
Admission: EM | Admit: 2020-04-06 | Discharge: 2020-04-06 | Disposition: A | Discharge status: Home / Self Care | Payer: 59 | Attending: Emergency Medicine | Admitting: Emergency Medicine

## 2020-04-06 ENCOUNTER — Emergency Department (HOSPITAL_COMMUNITY): Payer: 59

## 2020-04-06 ENCOUNTER — Other Ambulatory Visit: Payer: Self-pay

## 2020-04-06 ENCOUNTER — Encounter (HOSPITAL_COMMUNITY): Payer: Self-pay | Admitting: Emergency Medicine

## 2020-04-06 DIAGNOSIS — U071 COVID-19: Secondary | ICD-10-CM | POA: Diagnosis not present

## 2020-04-06 DIAGNOSIS — Z79899 Other long term (current) drug therapy: Secondary | ICD-10-CM | POA: Diagnosis not present

## 2020-04-06 DIAGNOSIS — E860 Dehydration: Secondary | ICD-10-CM

## 2020-04-06 DIAGNOSIS — R519 Headache, unspecified: Secondary | ICD-10-CM | POA: Insufficient documentation

## 2020-04-06 LAB — CBC
HCT: 43 % (ref 36.0–46.0)
Hemoglobin: 15 g/dL (ref 12.0–15.0)
MCH: 32.8 pg (ref 26.0–34.0)
MCHC: 34.9 g/dL (ref 30.0–36.0)
MCV: 94.1 fL (ref 80.0–100.0)
Platelets: 162 10*3/uL (ref 150–400)
RBC: 4.57 MIL/uL (ref 3.87–5.11)
RDW: 12.1 % (ref 11.5–15.5)
WBC: 5.7 10*3/uL (ref 4.0–10.5)
nRBC: 0 % (ref 0.0–0.2)

## 2020-04-06 LAB — I-STAT BETA HCG BLOOD, ED (MC, WL, AP ONLY): I-stat hCG, quantitative: <5 m[IU]/mL (ref <5)

## 2020-04-06 LAB — URINALYSIS, ROUTINE W REFLEX MICROSCOPIC
Bilirubin Urine: NEGATIVE
Glucose, UA: NEGATIVE mg/dL
Hgb urine dipstick: NEGATIVE
Ketones, ur: 5 mg/dL — AB
Leukocytes,Ua: NEGATIVE
Nitrite: NEGATIVE
Protein, ur: 100 mg/dL — AB
Specific Gravity, Urine: 1.027 (ref 1.005–1.030)
pH: 5 (ref 5.0–8.0)

## 2020-04-06 LAB — COMPREHENSIVE METABOLIC PANEL
ALT: 33 U/L (ref 0–44)
AST: 50 U/L — ABNORMAL HIGH (ref 15–41)
Albumin: 3.6 g/dL (ref 3.5–5.0)
Alkaline Phosphatase: 84 U/L (ref 38–126)
Anion gap: 14 (ref 5–15)
BUN: 13 mg/dL (ref 6–20)
CO2: 23 mmol/L (ref 22–32)
Calcium: 8.9 mg/dL (ref 8.9–10.3)
Chloride: 97 mmol/L — ABNORMAL LOW (ref 98–111)
Creatinine, Ser: 1.13 mg/dL — ABNORMAL HIGH (ref 0.44–1.00)
GFR calc Af Amer: >60 mL/min (ref >60)
GFR calc non Af Amer: 56 mL/min — ABNORMAL LOW (ref >60)
Glucose, Bld: 119 mg/dL — ABNORMAL HIGH (ref 70–99)
Potassium: 3.9 mmol/L (ref 3.5–5.1)
Sodium: 134 mmol/L — ABNORMAL LOW (ref 135–145)
Total Bilirubin: 0.6 mg/dL (ref 0.3–1.2)
Total Protein: 8.7 g/dL — ABNORMAL HIGH (ref 6.5–8.1)

## 2020-04-06 LAB — TROPONIN I (HIGH SENSITIVITY): Troponin I (High Sensitivity): 2 ng/L (ref <18)

## 2020-04-06 LAB — RAPID URINE DRUG SCREEN, HOSP PERFORMED
Amphetamines: NOT DETECTED
Barbiturates: NOT DETECTED
Benzodiazepines: NOT DETECTED
Cocaine: NOT DETECTED
Opiates: NOT DETECTED
Tetrahydrocannabinol: POSITIVE — AB

## 2020-04-06 LAB — LIPASE, BLOOD: Lipase: 40 U/L (ref 11–51)

## 2020-04-06 LAB — SARS CORONAVIRUS 2 BY RT PCR (HOSPITAL ORDER, PERFORMED IN ~~LOC~~ HOSPITAL LAB): SARS Coronavirus 2: POSITIVE — AB

## 2020-04-06 MED ORDER — SODIUM CHLORIDE 0.9 % IV BOLUS
1000.0000 mL | Freq: Once | INTRAVENOUS | Status: AC
  Administered 2020-04-06: 1000 mL via INTRAVENOUS

## 2020-04-06 MED ORDER — ONDANSETRON HCL 4 MG/2ML IJ SOLN
4.0000 mg | Freq: Once | INTRAMUSCULAR | Status: AC
  Administered 2020-04-06: 4 mg via INTRAVENOUS
  Filled 2020-04-06: qty 2

## 2020-04-06 MED ORDER — ACETAMINOPHEN 325 MG PO TABS
650.0000 mg | ORAL_TABLET | Freq: Once | ORAL | Status: AC | PRN
  Administered 2020-04-06: 650 mg via ORAL
  Filled 2020-04-06: qty 2

## 2020-04-06 MED ORDER — ONDANSETRON HCL 4 MG PO TABS: 4.0000 mg | ORAL_TABLET | Freq: Four times a day (QID) | ORAL | 0 refills | Status: DC

## 2020-04-06 NOTE — ED Notes (Addendum)
Pt went unresponsive while administering tylenol with jerking of body. Was able to get one tablet out of her mouth before her mouth clinched tight and unable to get second tablet out.

## 2020-04-06 NOTE — Discharge Instructions (Signed)
Please return for any problem.   Follow up with your regular medical provider tomorrow.   Drink plenty of fluids.  Use Tylenol for fever and body aches.
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
## 2020-04-06 NOTE — ED Notes (Signed)
Pt placed on purewick on 70mmHg.

## 2020-04-06 NOTE — ED Triage Notes (Signed)
Pt tested Covid+ yesterday. For a week had SOB, fatigue, body aches, chest pains, nausea, lost taste. Took mucinex this Am.

## 2020-04-07 ENCOUNTER — Telehealth: Payer: Self-pay | Admitting: Adult Health

## 2020-04-07 NOTE — Telephone Encounter (Signed)
Called and LMOM regarding monoclonal antibody treatment for COVID 19 given to those who are at risk for complications and/or hospitalization of the virus.  Patient meets criteria based on: BMI greater than 25.    Call back number given: [phone]  My chart message: unable to send  Nattaly Yebra, NP  

## 2020-05-07 ENCOUNTER — Other Ambulatory Visit: Payer: Self-pay

## 2020-05-07 ENCOUNTER — Ambulatory Visit (INDEPENDENT_AMBULATORY_CARE_PROVIDER_SITE_OTHER): Payer: 59

## 2020-05-07 ENCOUNTER — Ambulatory Visit (HOSPITAL_COMMUNITY)
Admission: EM | Admit: 2020-05-07 | Discharge: 2020-05-07 | Disposition: A | Discharge status: Home / Self Care | Payer: 59 | Attending: Urgent Care | Admitting: Urgent Care

## 2020-05-07 ENCOUNTER — Encounter (HOSPITAL_COMMUNITY): Payer: Self-pay | Admitting: *Deleted

## 2020-05-07 DIAGNOSIS — S8002XA Contusion of left knee, initial encounter: Secondary | ICD-10-CM

## 2020-05-07 DIAGNOSIS — S80212A Abrasion, left knee, initial encounter: Secondary | ICD-10-CM

## 2020-05-07 DIAGNOSIS — M25561 Pain in right knee: Secondary | ICD-10-CM | POA: Diagnosis not present

## 2020-05-07 DIAGNOSIS — W19XXXA Unspecified fall, initial encounter: Secondary | ICD-10-CM

## 2020-05-07 DIAGNOSIS — M25562 Pain in left knee: Secondary | ICD-10-CM | POA: Diagnosis not present

## 2020-05-07 DIAGNOSIS — S80211A Abrasion, right knee, initial encounter: Secondary | ICD-10-CM

## 2020-05-07 MED ORDER — TETANUS-DIPHTH-ACELL PERTUSSIS 5-2.5-18.5 LF-MCG/0.5 IM SUSP
0.5000 mL | Freq: Once | INTRAMUSCULAR | Status: AC
  Administered 2020-05-07: 0.5 mL via INTRAMUSCULAR

## 2020-05-07 MED ORDER — NAPROXEN 500 MG PO TABS: 500.0000 mg | ORAL_TABLET | Freq: Two times a day (BID) | ORAL | 0 refills | Status: AC

## 2020-05-07 MED ORDER — KETOROLAC TROMETHAMINE 60 MG/2ML IM SOLN
60.0000 mg | Freq: Once | INTRAMUSCULAR | Status: AC
  Administered 2020-05-07: 60 mg via INTRAMUSCULAR

## 2020-05-07 MED ORDER — KETOROLAC TROMETHAMINE 60 MG/2ML IM SOLN
INTRAMUSCULAR | Status: AC
  Filled 2020-05-07: qty 2

## 2020-05-07 MED ORDER — TETANUS-DIPHTH-ACELL PERTUSSIS 5-2.5-18.5 LF-MCG/0.5 IM SUSP
INTRAMUSCULAR | Status: AC
  Filled 2020-05-07: qty 0.5

## 2020-05-07 NOTE — ED Provider Notes (Signed)
[place]   MRN: [id number] DOB: 04-10-1966  Subjective:   Samantha Fernandez is a 54 y.o. female presenting for suffering a bilateral knee injury this morning. Patient states that she was protecting her dog from other dogs attacking her. She fell onto the concrete with her knees to shoulder down. She bears most of the injury to her left knee. Has since had significant pain and suffered scrapes to both of her knees. Has not taken anything for pain. Needs to have her Tdap updated.  No current facility-administered medications for this encounter.  Current Outpatient Medications:  Marland Kitchen  Multiple Vitamins-Minerals (MULTIVITAMIN WITH MINERALS) tablet, Take 1 tablet by mouth daily., Disp: , Rfl:  .  ibuprofen (ADVIL,MOTRIN) 600 MG tablet, Take 1 tablet (600 mg total) by mouth every 6 (six) hours as needed for cramping. (Patient not taking: Reported on 06/07/2018), Disp: 30 tablet, Rfl: 0 .  ondansetron (ZOFRAN) 4 MG tablet, Take 1 tablet (4 mg total) by mouth every 6 (six) hours., Disp: 12 tablet, Rfl: 0   Allergies  Allergen Reactions  . Penicillins Rash    Has patient had a PCN reaction causing immediate rash, facial/tongue/throat swelling, SOB or lightheadedness with hypotension: Yes Has patient had a PCN reaction causing severe rash involving mucus membranes or skin necrosis:No Has patient had a PCN reaction that required hospitalization: No Has patient had a PCN reaction occurring within the last 10 years: No If all of the above answers are "NO", then may proceed with Cephalosporin use.     Past Medical History:  Diagnosis Date  . Fibroids      Past Surgical History:  Procedure Laterality Date  . abdominal plasty    . CESAREAN SECTION    . IR ANGIOGRAM PELVIS SELECTIVE OR SUPRASELECTIVE  05/05/2018  . IR ANGIOGRAM PELVIS SELECTIVE OR SUPRASELECTIVE  05/05/2018  . IR ANGIOGRAM SELECTIVE EACH ADDITIONAL VESSEL  05/05/2018  . IR ANGIOGRAM SELECTIVE EACH  ADDITIONAL VESSEL  05/05/2018  . IR EMBO TUMOR ORGAN ISCHEMIA INFARCT INC GUIDE ROADMAPPING  05/05/2018  . IR RADIOLOGIST EVAL & MGMT  01/18/2018  . IR RADIOLOGIST EVAL & MGMT  06/07/2018  . IR US GUIDE VASC ACCESS RIGHT  05/05/2018  . TUBAL LIGATION      Family History  Problem Relation Age of Onset  . Diabetes Mother   . Diabetes Father   . Cancer Father   . Hypertension Father     Social History   Tobacco Use  . Smoking status: Never Smoker  . Smokeless tobacco: Never Used  Vaping Use  . Vaping Use: Never used  Substance Use Topics  . Alcohol use: No    Alcohol/week: 0.0 standard drinks  . Drug use: No    ROS   Objective:   Vitals: BP (!) 144/89 (BP Location: Left Arm)   Pulse 89   Temp 98.2 F (36.8 C) (Oral)   Resp 16   Ht 5\' 2"  (1.575 m)   Wt 172 lb (78 kg)   LMP 01/14/2018   SpO2 98%   BMI 31.46 kg/m   Physical Exam Constitutional:      General: She is not in acute distress.    Appearance: Normal appearance. She is well-developed. She is not ill-appearing, toxic-appearing or diaphoretic.  HENT:     Head: Normocephalic and atraumatic.     Nose: Nose normal.     Mouth/Throat:     Mouth: Mucous membranes are moist.     Pharynx: Oropharynx  is clear.  Eyes:     General: No scleral icterus.       Right eye: No discharge.        Left eye: No discharge.     Extraocular Movements: Extraocular movements intact.     Conjunctiva/sclera: Conjunctivae normal.     Pupils: Pupils are equal, round, and reactive to light.  Cardiovascular:     Rate and Rhythm: Normal rate.  Pulmonary:     Effort: Pulmonary effort is normal.  Musculoskeletal:       Legs:     Comments: 2 abrasions over both knees as outlined. Patient has significant tenderness, left worse than right. Decreased range of motion for the left knee. Near full range of motion for the right knee.  Skin:    General: Skin is warm and dry.  Neurological:     General: No focal deficit present.      Mental Status: She is alert and oriented to person, place, and time.  Psychiatric:        Mood and Affect: Mood normal.        Behavior: Behavior normal.        Thought Content: Thought content normal.        Judgment: Judgment normal.     DG Knee Complete 4 Views Left  Result Date: 05/07/2020 CLINICAL DATA:  Left knee pain EXAM: LEFT KNEE - COMPLETE 4+ VIEW COMPARISON:  None. FINDINGS: No evidence of fracture, dislocation, or joint effusion. No evidence of arthropathy or other focal bone abnormality. Soft tissues are unremarkable. IMPRESSION: Negative. Electronically Signed   By: Rolm Baptise M.D.   On: 05/07/2020 11:42    Assessment and Plan :   PDMP not reviewed this encounter.  1. Acute bilateral knee pain   2. Contusion of left knee, initial encounter   3. Abrasion of left knee, initial encounter   4. Abrasion, knee, right, initial encounter   5. Fall, initial encounter     Tdap updated. Wet dressings applied to both knees. Start naproxen for pain and inflammation. X-rays negative. Wound care reviewed. Counseled patient on potential for adverse effects with medications prescribed/recommended today, ER and return-to-clinic precautions discussed, patient verbalized understanding.    Jaynee Eagles, PA-C 05/07/20 1213

## 2020-05-07 NOTE — ED Triage Notes (Signed)
Pt reports falling onto knees this AM when her dog was attacked by neighbors dog. Pt reports pin to LT knee . Pt has open wounds to bil. Knees.

## 2020-05-07 NOTE — Discharge Instructions (Signed)
Please change your dressing 2-3 times daily. Use non-stick gauze. Alternate between wet (applying ointment) and dry dressings (no ointments). Each time you change your dressing, make sure you clean gently around the perimeter of the wound and the wound itself with gentle soap and warm water. Pat your wound dry and let it air out if possible for 1-2 hours before reapplying another dressing. Once your wound scabs over completely, you do not need to keep applying dressings unless you will be particularly active.

## 2022-07-02 IMAGING — CT CT HEAD W/O CM
3 series · 15 of 47 positions shown, 18 images · non-contrast
Comparison: None.

CLINICAL DATA: Seizure.

EXAM:
CT HEAD WITHOUT CONTRAST
TECHNIQUE: Contiguous axial images were obtained from the base of the skull
through the vertex without intravenous contrast.

[Series 2: head wo · axial · 0.47mm/px · z∈[-129,-4]mm · 9 of 31 slices shown, 12 images]
[im 3/31  brain]
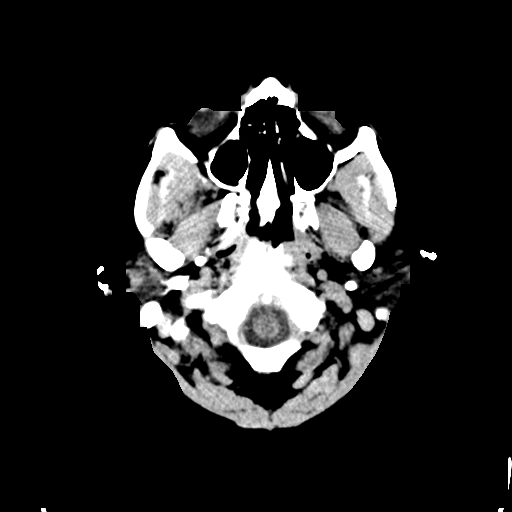
[im 3/31  bone]
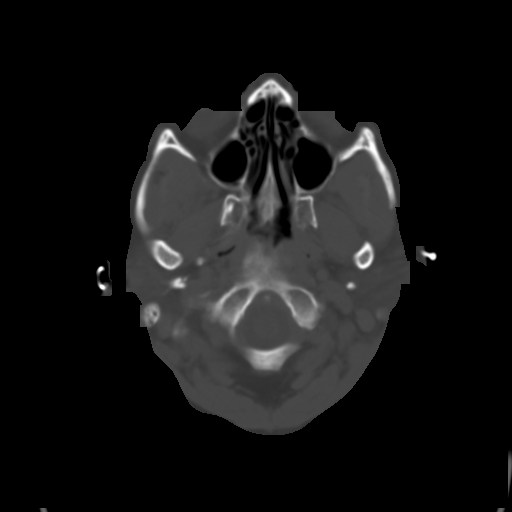
[im 6/31  brain]
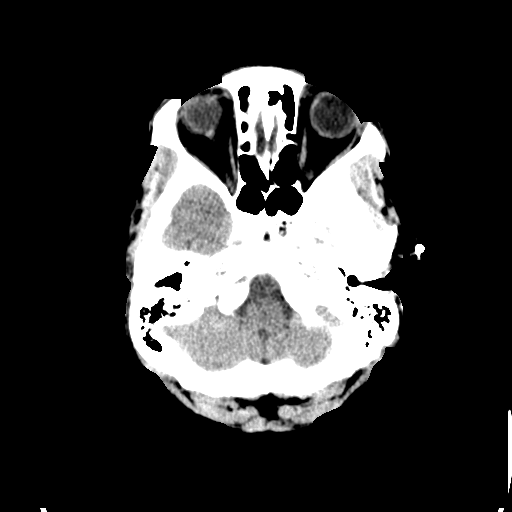
[im 9/31  brain]
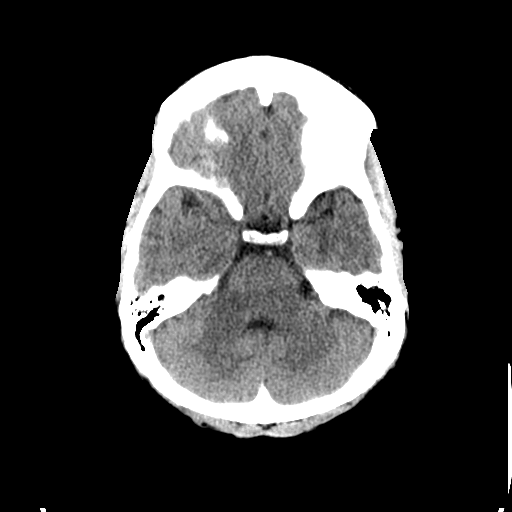
[im 12/31  brain]
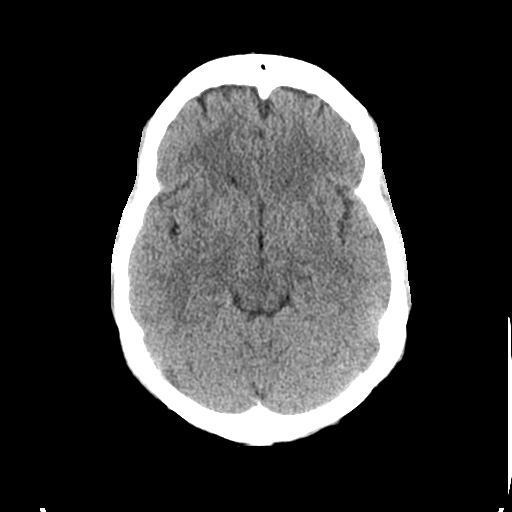
[im 16/31  brain]
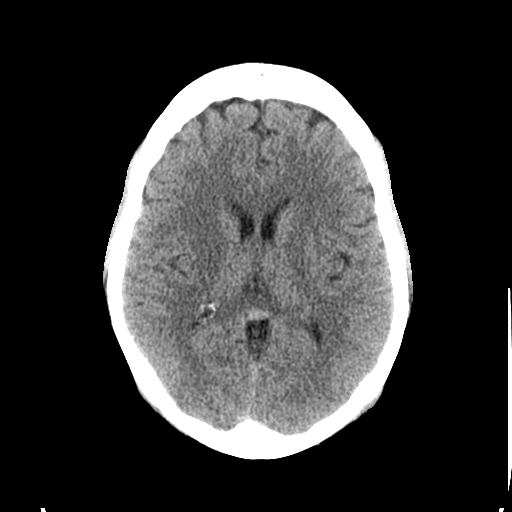
[im 16/31  bone]
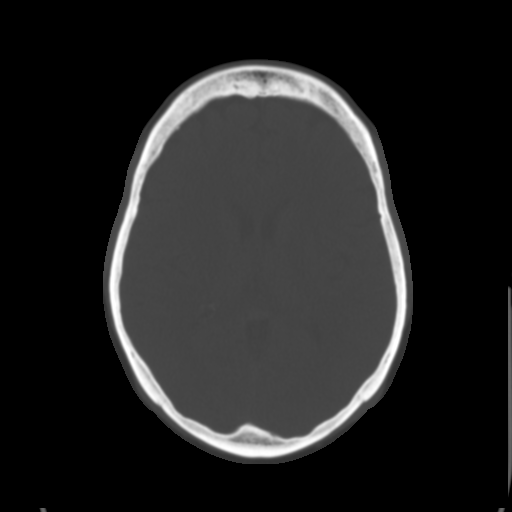
[im 19/31  brain]
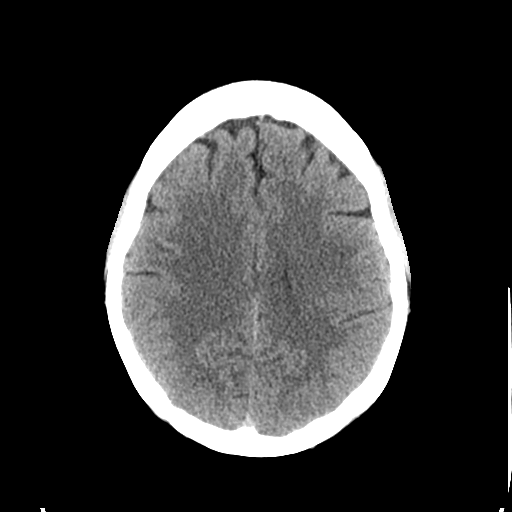
[im 22/31  brain]
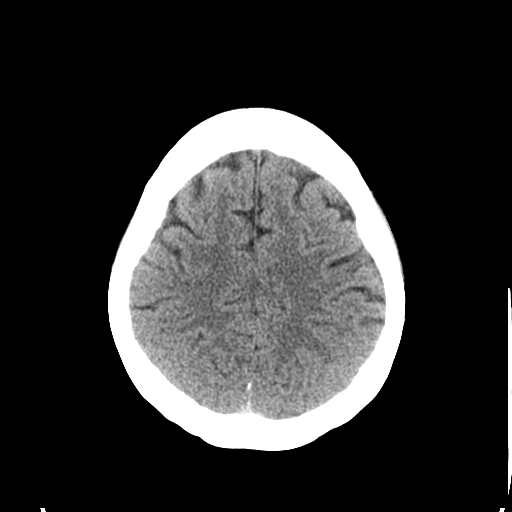
[im 25/31  brain]
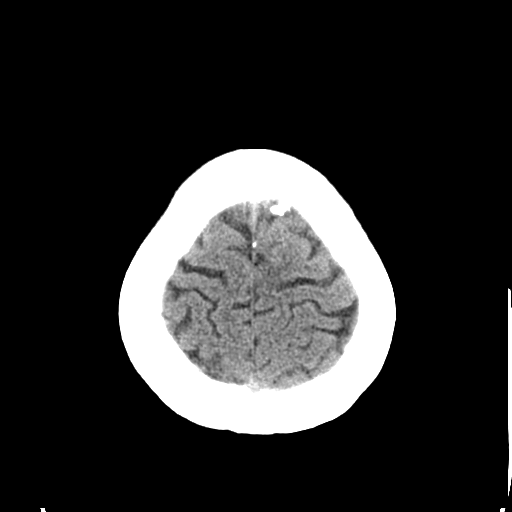
[im 28/31  brain]
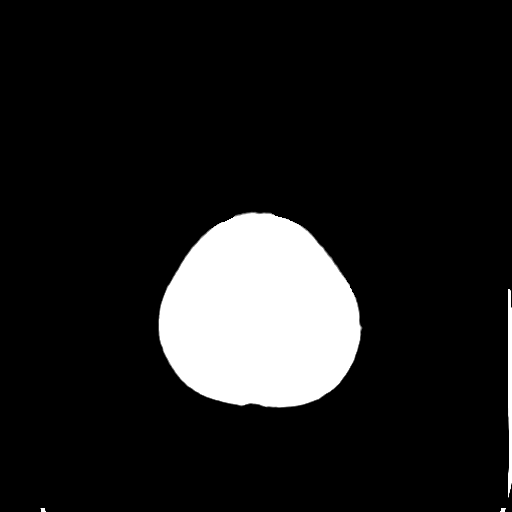
[im 28/31  bone]
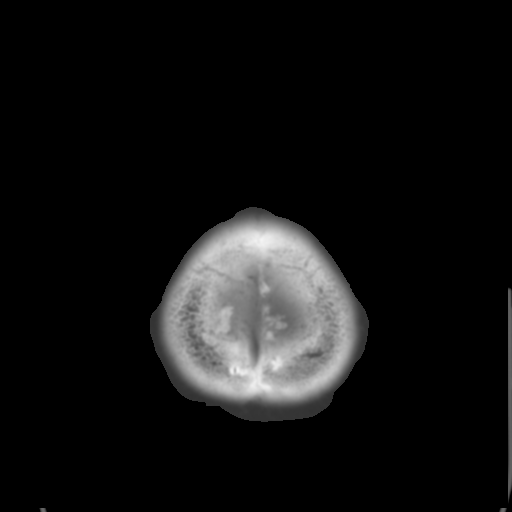

[Series 4: coronal soft tissue · coronal · 0.31mm/px · 3 of 63 slices shown]
[im 21/63  brain]
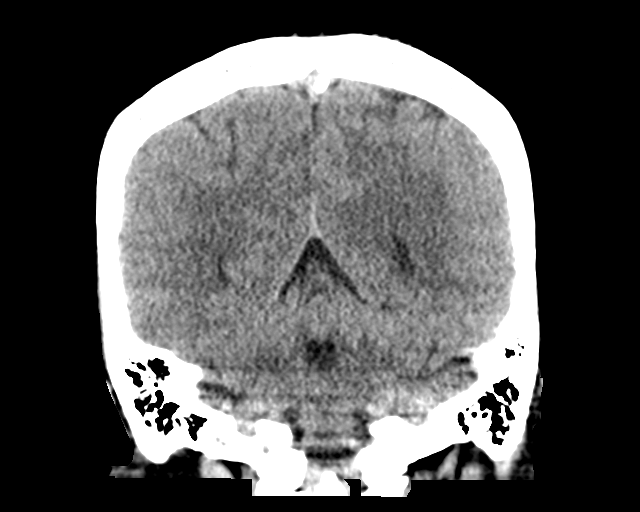
[im 28/63  brain]
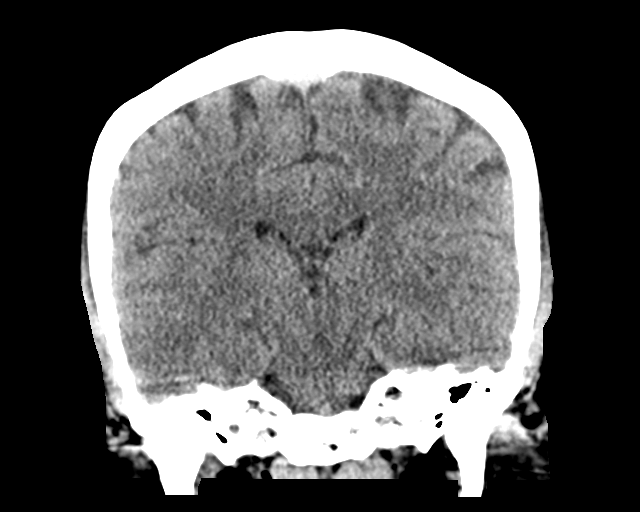
[im 35/63  brain]
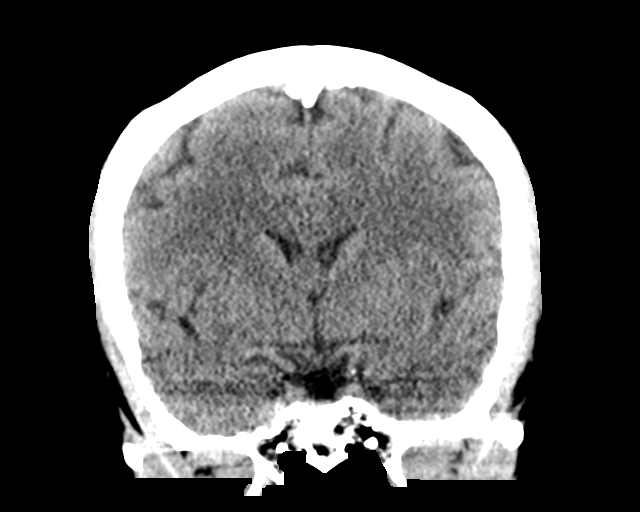

[Series 5: sagittal soft tissue · sagittal · 0.32mm/px · 3 of 49 slices shown]
[im 17/49  brain]
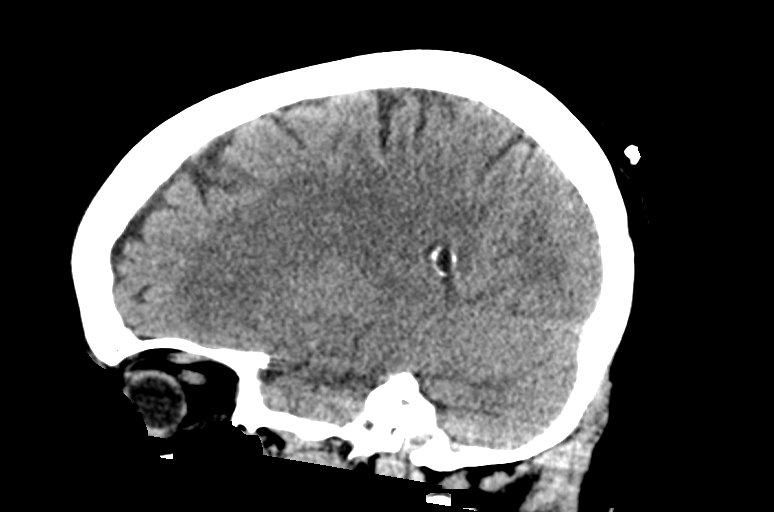
[im 25/49  brain]
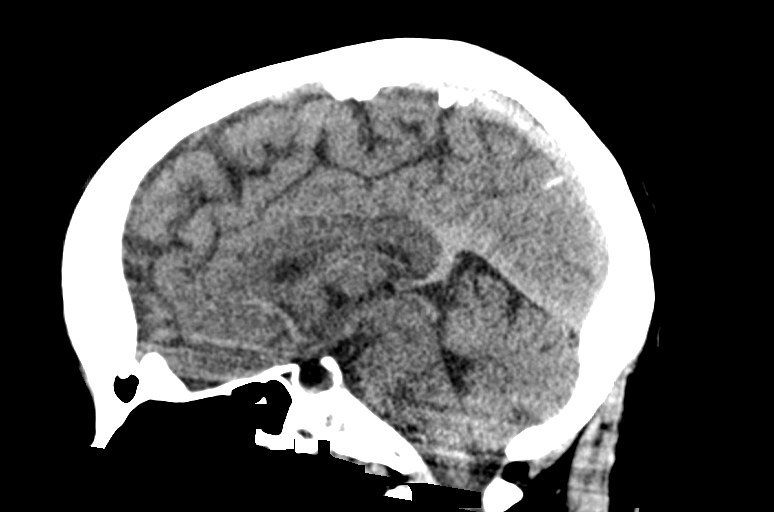
[im 33/49  brain]
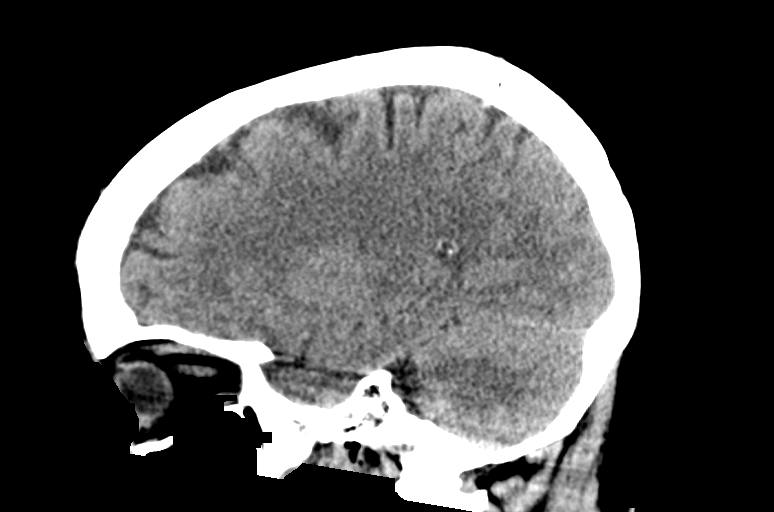

[15 of 47 positions shown; findings below may reference images not displayed]

FINDINGS: Brain: No evidence of acute infarction, hemorrhage, hydrocephalus,
extra-axial collection or mass lesion/mass effect.

Vascular: No hyperdense vessel or unexpected calcification.

Skull: Normal. Negative for fracture or focal lesion.

Sinuses/Orbits: No acute finding.

Other: None.
IMPRESSION: Normal.  No cause for symptoms identified.

## 2024-06-11 ENCOUNTER — Encounter: Payer: Self-pay | Admitting: Obstetrics and Gynecology

## 2024-06-28 ENCOUNTER — Telehealth: Payer: Self-pay

## 2024-06-28 NOTE — Telephone Encounter (Signed)
 Left message for patient about the need to reschedule her [facility] appointment on 08/07/24 due to not having a provider that day. If patient calls back work her in on a day prior to 08/07/24 so she will be able to keep her [place] appointment. Left name and number for patient to call back.

## 2024-07-11 ENCOUNTER — Telehealth: Payer: Self-pay

## 2024-07-11 NOTE — Telephone Encounter (Signed)
 Telephoned patient at home number. Mailbox full at this time, unable to leave a message for patient to reschedule [facility] appointment.

## 2024-07-19 ENCOUNTER — Telehealth: Payer: Self-pay

## 2024-07-19 NOTE — Telephone Encounter (Signed)
 Attempted to contact patient again to reschedule appt on 08/07/24. Have been unsuccessful in reaching patient. Voicemail box is full, unable to leave message. I am cancelling the appointment at this time. Patient will need to call back to reschedule if she wants to be seen in [facility].

## 2024-08-07 ENCOUNTER — Ambulatory Visit: Payer: Self-pay

## 2024-08-27 NOTE — Progress Notes (Unsigned)
 [name] is a 59 y.o. No obstetric history on file. female who presents to [facility] clinic today with {Blank single:19197::no complaints,complaint of} ***.    Pap Smear: Pap smear completed today. Last Pap smear was *** at *** clinic and was {Blank single:19197::normal,abnormal - ***}. Per patient has {Blank single:19197::no history,history} of an abnormal Pap smear. Last Pap smear result {Blank single:19197::is available in,is not available in} Epic.   Physical exam: Breasts Breasts symmetrical. No skin abnormalities bilateral breasts. No nipple retraction bilateral breasts. No nipple discharge bilateral breasts. No lymphadenopathy. No lumps palpated bilateral breasts.       Pelvic/Bimanual Ext Genitalia No lesions, no swelling and no discharge observed on external genitalia.        Vagina Vagina pink and normal texture. No lesions or discharge observed in vagina.        Cervix Cervix is present. Cervix pink and of normal texture. No discharge observed.    Uterus Uterus is present and palpable. Uterus in normal position and normal size.        Adnexae Bilateral ovaries present and palpable. No tenderness on palpation.         Rectovaginal No rectal exam completed today since patient had no rectal complaints. No skin abnormalities observed on exam.     Smoking History: Patient has {Blank single:19197::never smoked,is a former smoker,is a current smoker at *** packs per day} ***referred to quit line.    Patient Navigation: Patient education provided. Access to services provided for patient through [facility] program.   Colorectal Cancer Screening: Per patient {Blank single:19197::has had colonoscopy completed on ***,has never had colonoscopy completed} No complaints today.    Breast and Cervical Cancer Risk Assessment: Patient {Blank single:19197::has,does not have} family history of breast cancer, known genetic mutations, or radiation  treatment to the chest before age 20. Patient {Blank single:19197::has,does not have} history of cervical dysplasia, immunocompromised, or DES exposure in-utero.  Risk Assessment   No risk assessment data     A: [facility] exam with pap smear Complaint of ***  P: Referred patient to [facility] for a screening mammogram. Appointment scheduled Tuesday, August 28, 2024 at 1245.  Driscilla Wanda SQUIBB, RN 08/27/2024 12:35 PM

## 2024-08-28 ENCOUNTER — Ambulatory Visit: Payer: Self-pay

## 2024-08-28 VITALS — BP 149/97 | Ht 62.0 in | Wt 194.0 lb

## 2024-08-28 DIAGNOSIS — Z1211 Encounter for screening for malignant neoplasm of colon: Secondary | ICD-10-CM

## 2024-08-28 DIAGNOSIS — Z803 Family history of malignant neoplasm of breast: Secondary | ICD-10-CM

## 2024-08-28 DIAGNOSIS — Z01419 Encounter for gynecological examination (general) (routine) without abnormal findings: Secondary | ICD-10-CM

## 2024-08-28 NOTE — Patient Instructions (Signed)
 Explained breast self awareness with Joi ROBINSON FORD. Pap smear completed today.  Let her know [facility] will cover Pap smears and HPV typing every 5 years unless has a history of abnormal Pap smears. Referred patient to [facility] for a screening mammogram. Appointment scheduled Tuesday, August 28, 2024 at 1245. Patient aware of appointment and will be there. Let patient know will follow up with her within the next couple weeks with results of Pap smear by letter or phone. Nestor ESSEX FORD verbalized understanding.  Dorien Bessent, Wanda Ship, RN 12:10 PM

## 2024-08-29 ENCOUNTER — Ambulatory Visit: Payer: Self-pay

## 2024-08-29 LAB — CYTOLOGY - PAP
Comment: NEGATIVE
Diagnosis: NEGATIVE
High risk HPV: NEGATIVE
